# Patient Record
Sex: Male | Born: 1975 | Race: Black or African American | Hispanic: No | Marital: Single | Smoking: Current some day smoker
Health system: Southern US, Community
[De-identification: ages and names within clinical notes are randomized; demographics above are authoritative.]

---

## 2001-01-31 ENCOUNTER — Emergency Department (HOSPITAL_COMMUNITY): Admission: EM | Admit: 2001-01-31 | Discharge: 2001-02-01 | Payer: Self-pay | Admitting: Emergency Medicine

## 2001-01-31 ENCOUNTER — Encounter: Payer: Self-pay | Admitting: Emergency Medicine

## 2004-04-18 ENCOUNTER — Emergency Department (HOSPITAL_COMMUNITY): Admission: EM | Admit: 2004-04-18 | Discharge: 2004-04-18 | Payer: Self-pay | Admitting: Emergency Medicine

## 2007-05-17 ENCOUNTER — Emergency Department (HOSPITAL_COMMUNITY): Admission: EM | Admit: 2007-05-17 | Discharge: 2007-05-17 | Payer: Self-pay | Admitting: Emergency Medicine

## 2007-05-22 ENCOUNTER — Encounter: Admission: RE | Admit: 2007-05-22 | Discharge: 2007-05-22 | Payer: Self-pay | Admitting: Chiropractic Medicine

## 2007-07-02 ENCOUNTER — Emergency Department (HOSPITAL_COMMUNITY): Admission: EM | Admit: 2007-07-02 | Discharge: 2007-07-02 | Payer: Self-pay | Admitting: Emergency Medicine

## 2007-07-13 ENCOUNTER — Emergency Department (HOSPITAL_COMMUNITY): Admission: EM | Admit: 2007-07-13 | Discharge: 2007-07-14 | Payer: Self-pay | Admitting: Emergency Medicine

## 2008-08-27 IMAGING — CR DG LUMBAR SPINE COMPLETE 4+V
5 series · 5 of 5 positions shown · non-contrast
Comparison: 05/22/2007

CLINICAL DATA: Lumbar back pain range the left leg

LUMBAR SPINE - COMPLETE 4+ VIEW

[t l-spine a.p.]
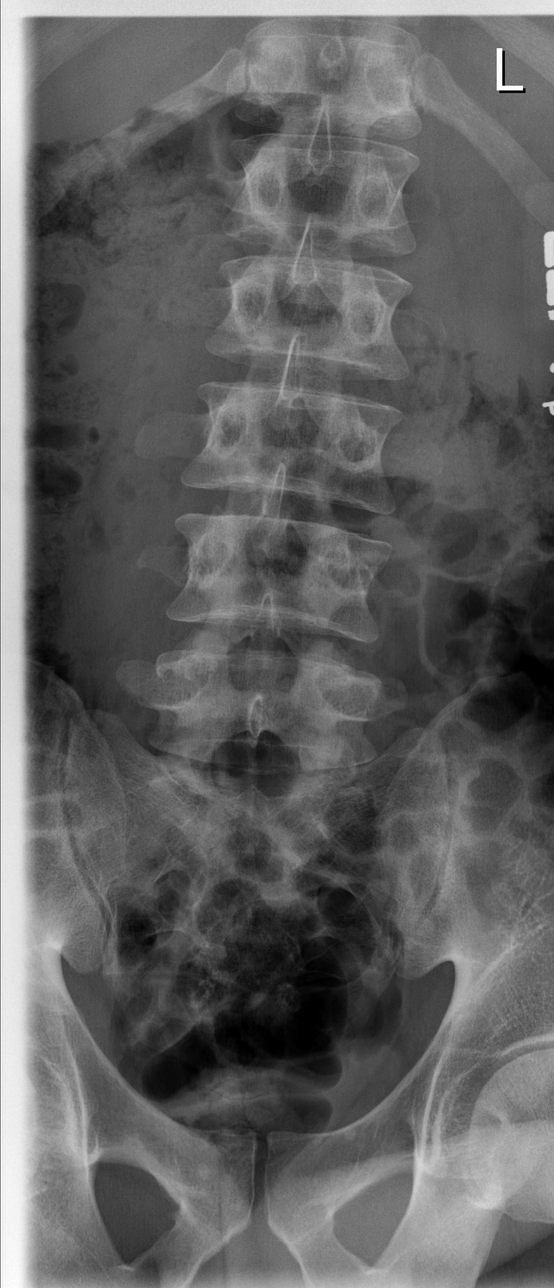

[t l-spine oblique exposure (1 of 2)]
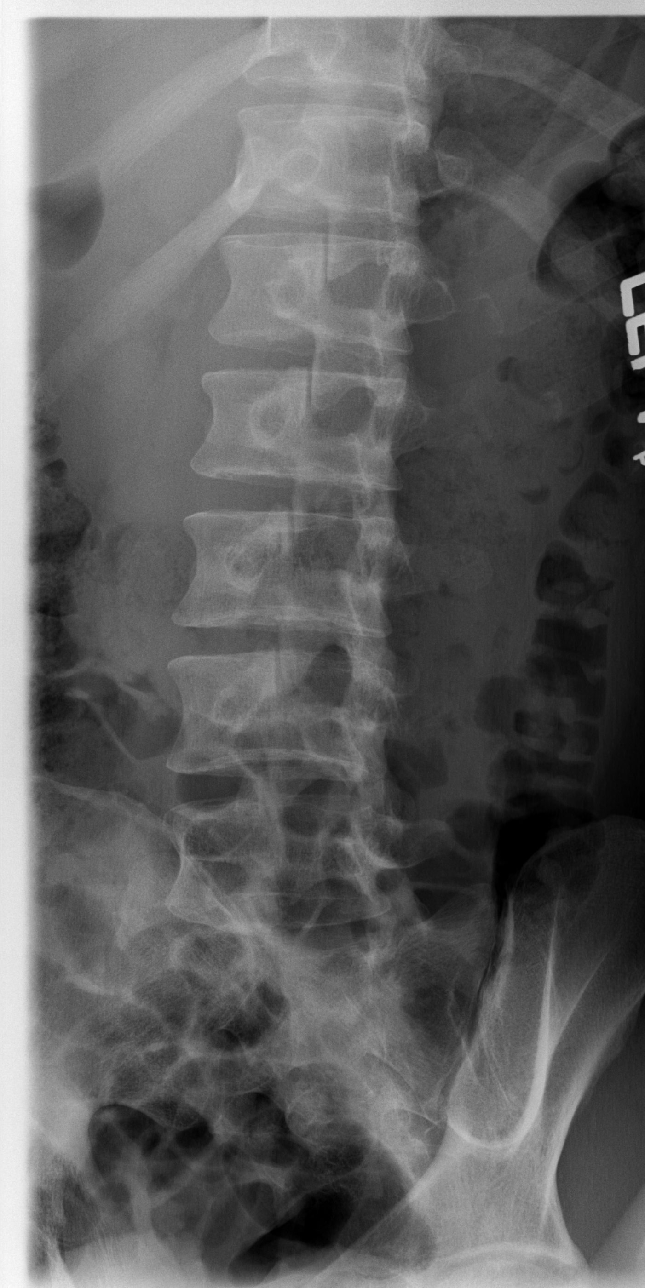

[t l-spine oblique exposure (2 of 2)]
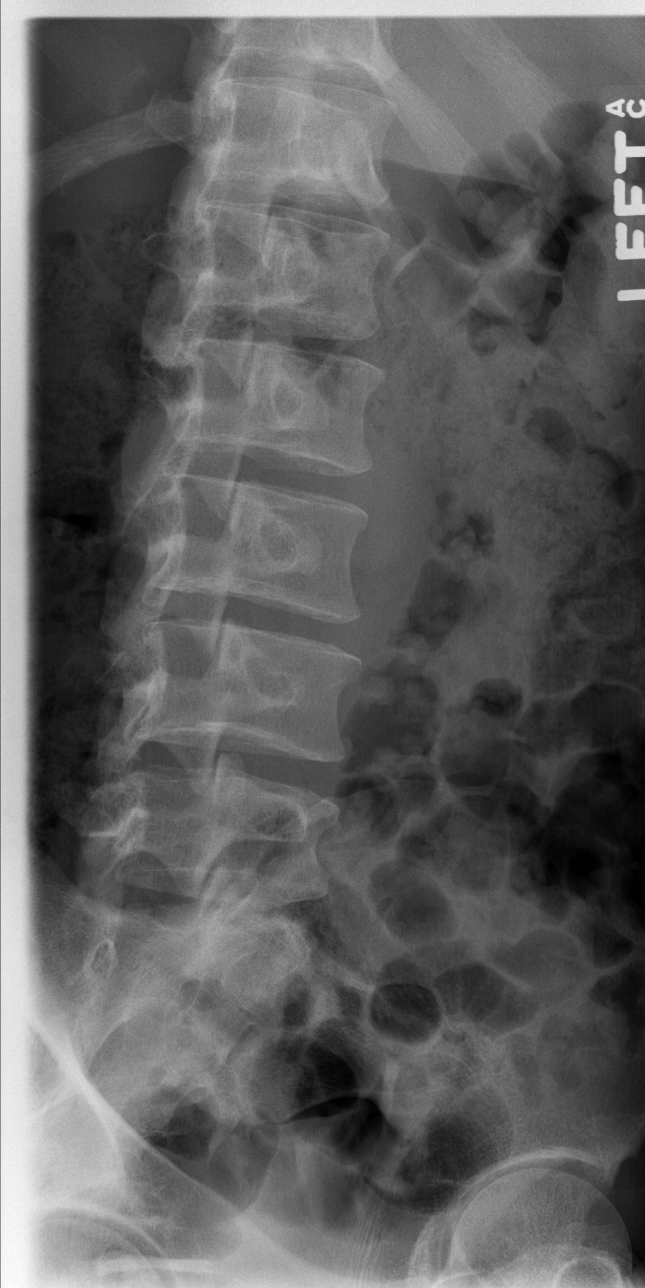

[t l-spine lat]
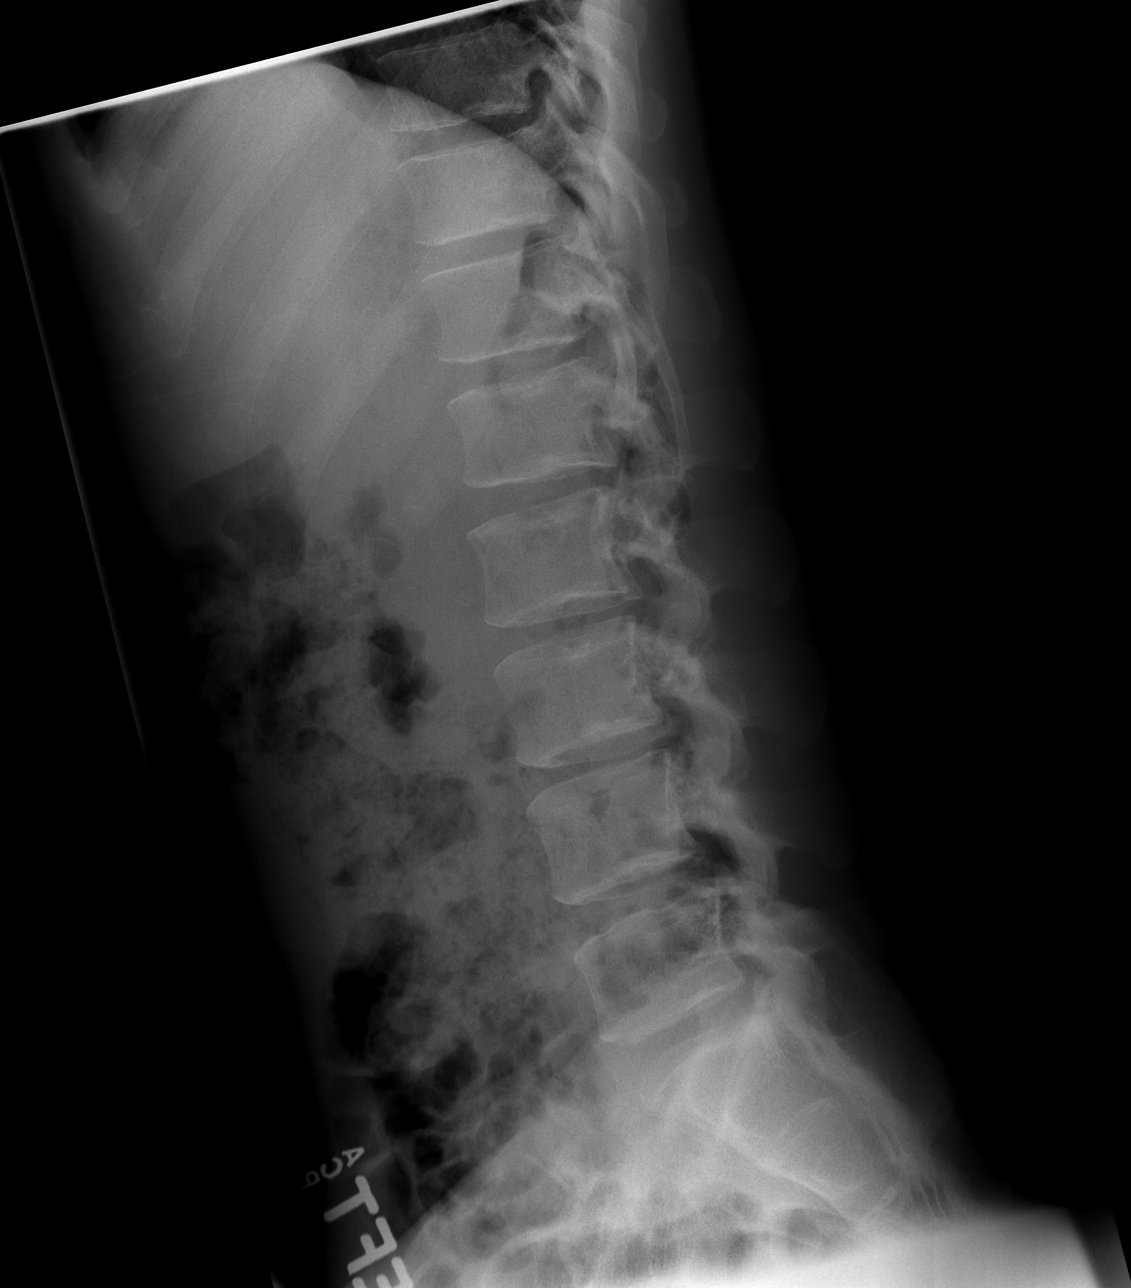

[t l-spine l5-s1 spot]
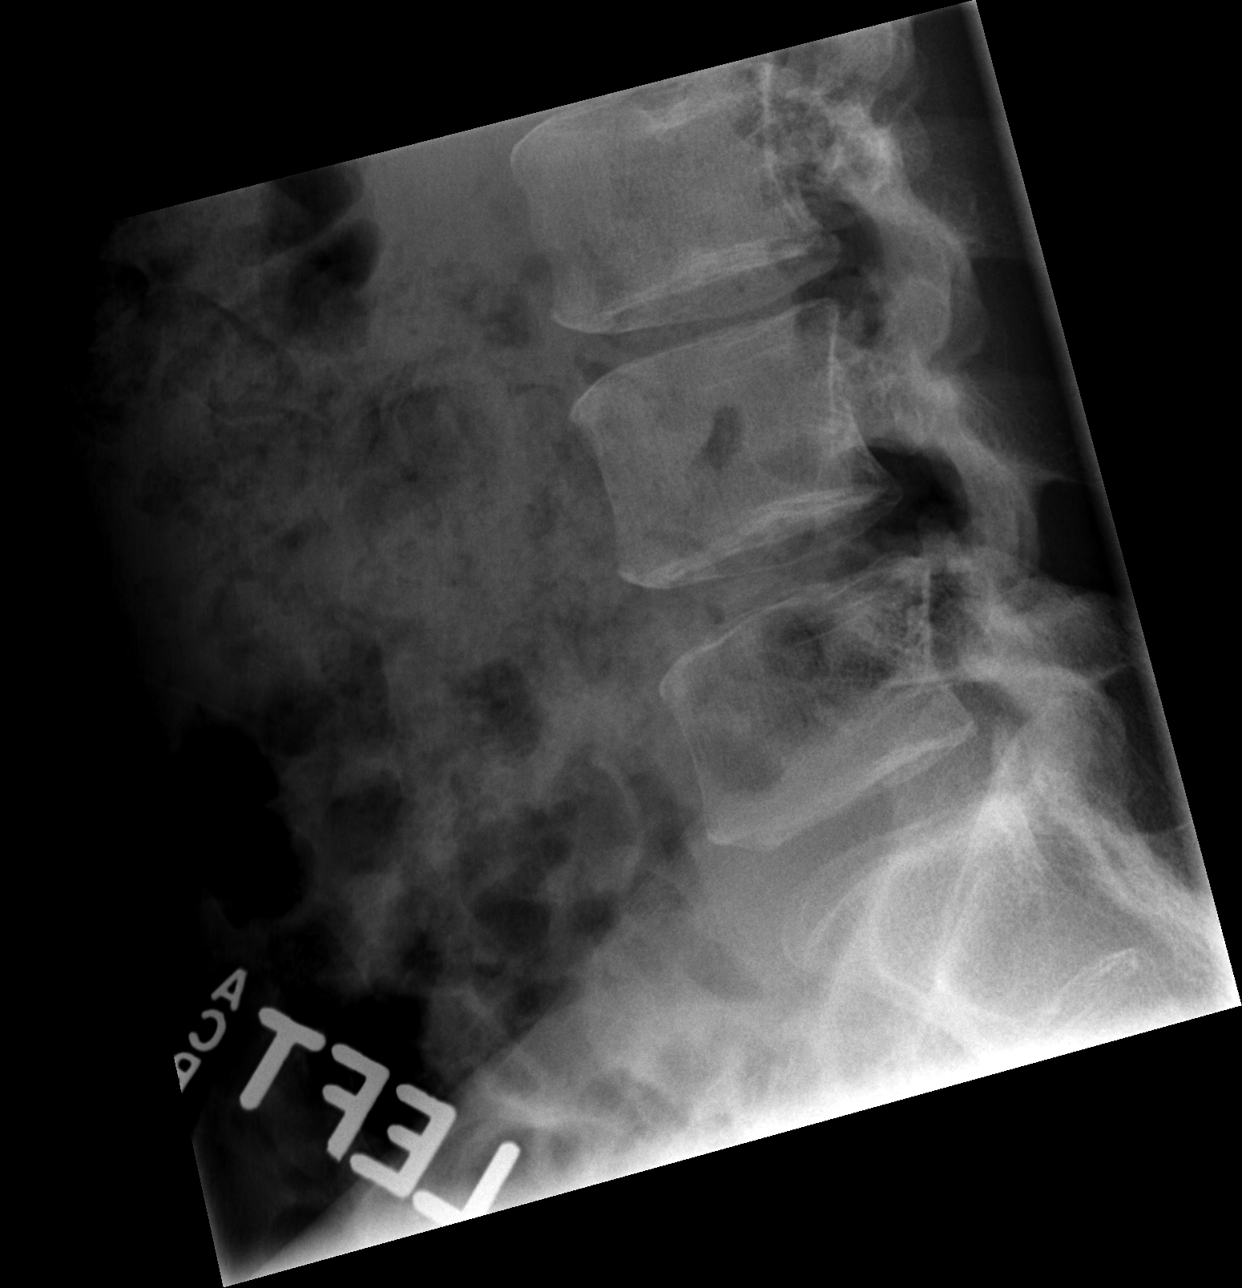

[5 of 5 positions shown; findings below may reference images not displayed]

FINDINGS: There is no evidence of lumbar spine fracture.  Alignment
is normal.  Intervertebral disc spaces are maintained.
IMPRESSION: Negative.

## 2011-07-06 ENCOUNTER — Encounter (HOSPITAL_COMMUNITY): Payer: Self-pay | Admitting: *Deleted

## 2011-07-06 ENCOUNTER — Emergency Department (HOSPITAL_COMMUNITY): Payer: Self-pay

## 2011-07-06 ENCOUNTER — Emergency Department (HOSPITAL_COMMUNITY)
Admission: EM | Admit: 2011-07-06 | Discharge: 2011-07-06 | Disposition: A | Discharge status: Home / Self Care | Payer: Self-pay | Attending: Emergency Medicine | Admitting: Emergency Medicine

## 2011-07-06 DIAGNOSIS — J069 Acute upper respiratory infection, unspecified: Secondary | ICD-10-CM | POA: Insufficient documentation

## 2011-07-06 DIAGNOSIS — R05 Cough: Secondary | ICD-10-CM

## 2011-07-06 DIAGNOSIS — F17200 Nicotine dependence, unspecified, uncomplicated: Secondary | ICD-10-CM | POA: Insufficient documentation

## 2011-07-06 MED ORDER — BENZONATATE 100 MG PO CAPS: 100.0000 mg | ORAL_CAPSULE | Freq: Three times a day (TID) | ORAL | Status: AC | PRN

## 2011-07-06 MED ORDER — ALBUTEROL SULFATE HFA 108 (90 BASE) MCG/ACT IN AERS
2.0000 | INHALATION_SPRAY | RESPIRATORY_TRACT | Status: AC
  Administered 2011-07-06: 2 via RESPIRATORY_TRACT
  Filled 2011-07-06: qty 6.7

## 2011-07-06 NOTE — ED Notes (Signed)
Pt states cough for several weeks.  States Mon am he awoke with body aches.  When pt coughed, he "noticed how painful ribs and lower back was" and has been coughing up "yellow/green" sputum.

## 2011-07-06 NOTE — Discharge Instructions (Signed)
RESOURCE GUIDE  Dental Problems  Patients with Medicaid: [facility]                     [facility] [address]                                           [address] Phone:  [phone]                                                  Phone:  [phone]  If unable to pay or uninsured, contact:  [facility] or [facility] to become qualified for the adult dental clinic.  Chronic Pain Problems Contact [facility]  [phone] Patients need to be referred by their primary care doctor.  Insufficient Money for Medicine Contact [employer]:  call "211" or [facility] 271-5999.  No Primary Care Doctor Call [facility]  832-8000 Other agencies that provide inexpensive medical care    [facility]  832-8035    [facility]  832-7272    [facility]  [phone]    [facility] Clinic  832-4777    [facility]  [phone]    [facility]  [phone]  Psychological Services [facility]  [phone] [facility]  [phone] [facility]   800 853-5163 (emergency services 641-4993)  Substance Abuse Resources [facility]  336-882-2125 [facility] 336-784-9470 [facility] 336-285-9073 [facility] 336-845-3988 Residential & Outpatient Substance Abuse Program  800-659-3381  Abuse/Neglect [place] Child Abuse Hotline (336) 641-3795 [place] [facility] Abuse Hotline 800-378-5315 (After Hours)  Emergency Shelter [facility] (336) 271-5985  M[facility] (336) 275-9566 [facility] (704) 372-4663  MRSA Hotline #:   832-7006    [place] [facility]  [facility] [place] [facility]     [employer]                          [facility] [address] [place]                       [address]      [address]  [place]                                                [place]                            [place] Phone:  [phone]                                   Phone:  [phone]                 Phone:  [phone]  [facility] Phone:  [phone]    [facility] Child Abuse Hotline 480-442-9637 8722854675 (After Hours)   Take the prescription as directed.  Use your albuterol inhaler (2 to 4 puffs) every 4 hours for the next 7 days, then as needed for cough, wheezing, or shortness of breath.  Take over the counter sudafed, as directed on packaging, for the next week.  Use over the counter normal saline nasal spray, as instructed in the Emergency Department, several times per day for the next 2 weeks.  Take over the counter tylenol and ibuprofen, as directed on packaging, as needed for discomfort.  Call your regular medical doctor today to schedule a follow up appointment within the next week.  Return to the Emergency Department immediately sooner if worsening.

## 2011-07-06 NOTE — ED Provider Notes (Signed)
History     CSN: [id number]  Arrival date & time 07/06/11  4098   First MD Initiated Contact with Patient 07/06/11 [phone]      Chief Complaint  Patient presents with  . Cough    malaise     HPI Pt was seen at 0715.  Per pt, c/o gradual onset and persistence of intermittent cough for the past week.  Describes the cough as productive of "yellow" sputum.  Denies fevers, no sore throat, no rash.    History reviewed. No pertinent past medical history.  History reviewed. No pertinent past surgical history.   History  Substance Use Topics  . Smoking status: Current Some Day Smoker  . Smokeless tobacco: Not on file  . Alcohol Use: 0.6 oz/week    1 Cans of beer per week     per day      Review of Systems ROS: Statement: All systems negative except as marked or noted in the HPI; Constitutional: Negative for fever and chills. ; ; Eyes: Negative for eye pain, redness and discharge. ; ; ENMT: Negative for ear pain, hoarseness, nasal congestion, sinus pressure and sore throat. ; ; Cardiovascular: Negative for chest pain, palpitations, diaphoresis, dyspnea and peripheral edema. ; ; Respiratory: +cough. Negative for wheezing and stridor. ; ; Gastrointestinal: Negative for nausea, vomiting, diarrhea, abdominal pain, blood in stool, hematemesis, jaundice and rectal bleeding. . ; ; Genitourinary: Negative for dysuria, flank pain and hematuria. ; ; Musculoskeletal: Negative for back pain and neck pain. Negative for swelling and trauma.; ; Skin: Negative for pruritus, rash, abrasions, blisters, bruising and skin lesion.; ; Neuro: Negative for headache, lightheadedness and neck stiffness. Negative for weakness, altered level of consciousness , altered mental status, extremity weakness, paresthesias, involuntary movement, seizure and syncope.     Allergies  Review of patient's allergies indicates no known allergies.  Home Medications  No current outpatient prescriptions on file.  BP 151/105   Pulse 90  Temp 98 F (36.7 C)  Resp 18  SpO2 100%  Physical Exam 0720: Physical examination:  Nursing notes reviewed; Vital signs and O2 SAT reviewed;  Constitutional: Well developed, Well nourished, Well hydrated, In no acute distress; Head:  Normocephalic, atraumatic; Eyes: EOMI, PERRL, No scleral icterus; ENMT: TM's clear bilat, +edemetous nasal turbinates bilat with clear rhinorrhea.  Mouth and pharynx normal, Mucous membranes moist; Neck: Supple, Full range of motion; Cardiovascular: Regular rate and rhythm, No murmur or gallop; Respiratory: Breath sounds clear & equal bilaterally, No wheezes, speaking full sentences with ease, Normal respiratory effort/excursion; Chest: Nontender, Movement normal;  Extremities: Pulses normal, No tenderness, No edema, No calf edema or asymmetry.; Neuro: AA&Ox3, Major CN grossly intact.  No gross focal motor or sensory deficits in extremities.; Skin: Color normal, Warm, Dry, no rash.    ED Course  Procedures    MDM  MDM Reviewed: nursing note and vitals Interpretation: x-ray     Dg Chest 2 View 07/06/2011  *RADIOLOGY REPORT*  Clinical Data: Cough and chest soreness.  CHEST - 2 VIEW  Comparison: Chest x-ray 05/17/2007.  Findings: Lung volumes are normal.  No consolidative airspace disease.  No pleural effusions.  No pneumothorax.  No pulmonary nodule or mass noted.  Pulmonary vasculature and the cardiomediastinal silhouette are within normal limits.  IMPRESSION: 1. No radiographic evidence of acute cardiopulmonary disease.  Original Report Authenticated By: Florencia Reasons, M.D.      8:18 AM:  No pneumonia on CXR.  Afebrile, Sats 100% R/A.  Feels better  after neb.  Will tx symptomatically for now.  Dx testing d/w pt and family.  Questions answered.  Verb understanding, agreeable to d/c home with outpt f/u.     Laray Anger, DO 07/07/11 1155

## 2012-08-19 IMAGING — CR DG CHEST 2V
2 series · 2 of 2 positions shown · non-contrast
Comparison: Chest x-ray 05/17/2007.

CLINICAL DATA: Cough and chest soreness.

CHEST - 2 VIEW

[w chest pa]
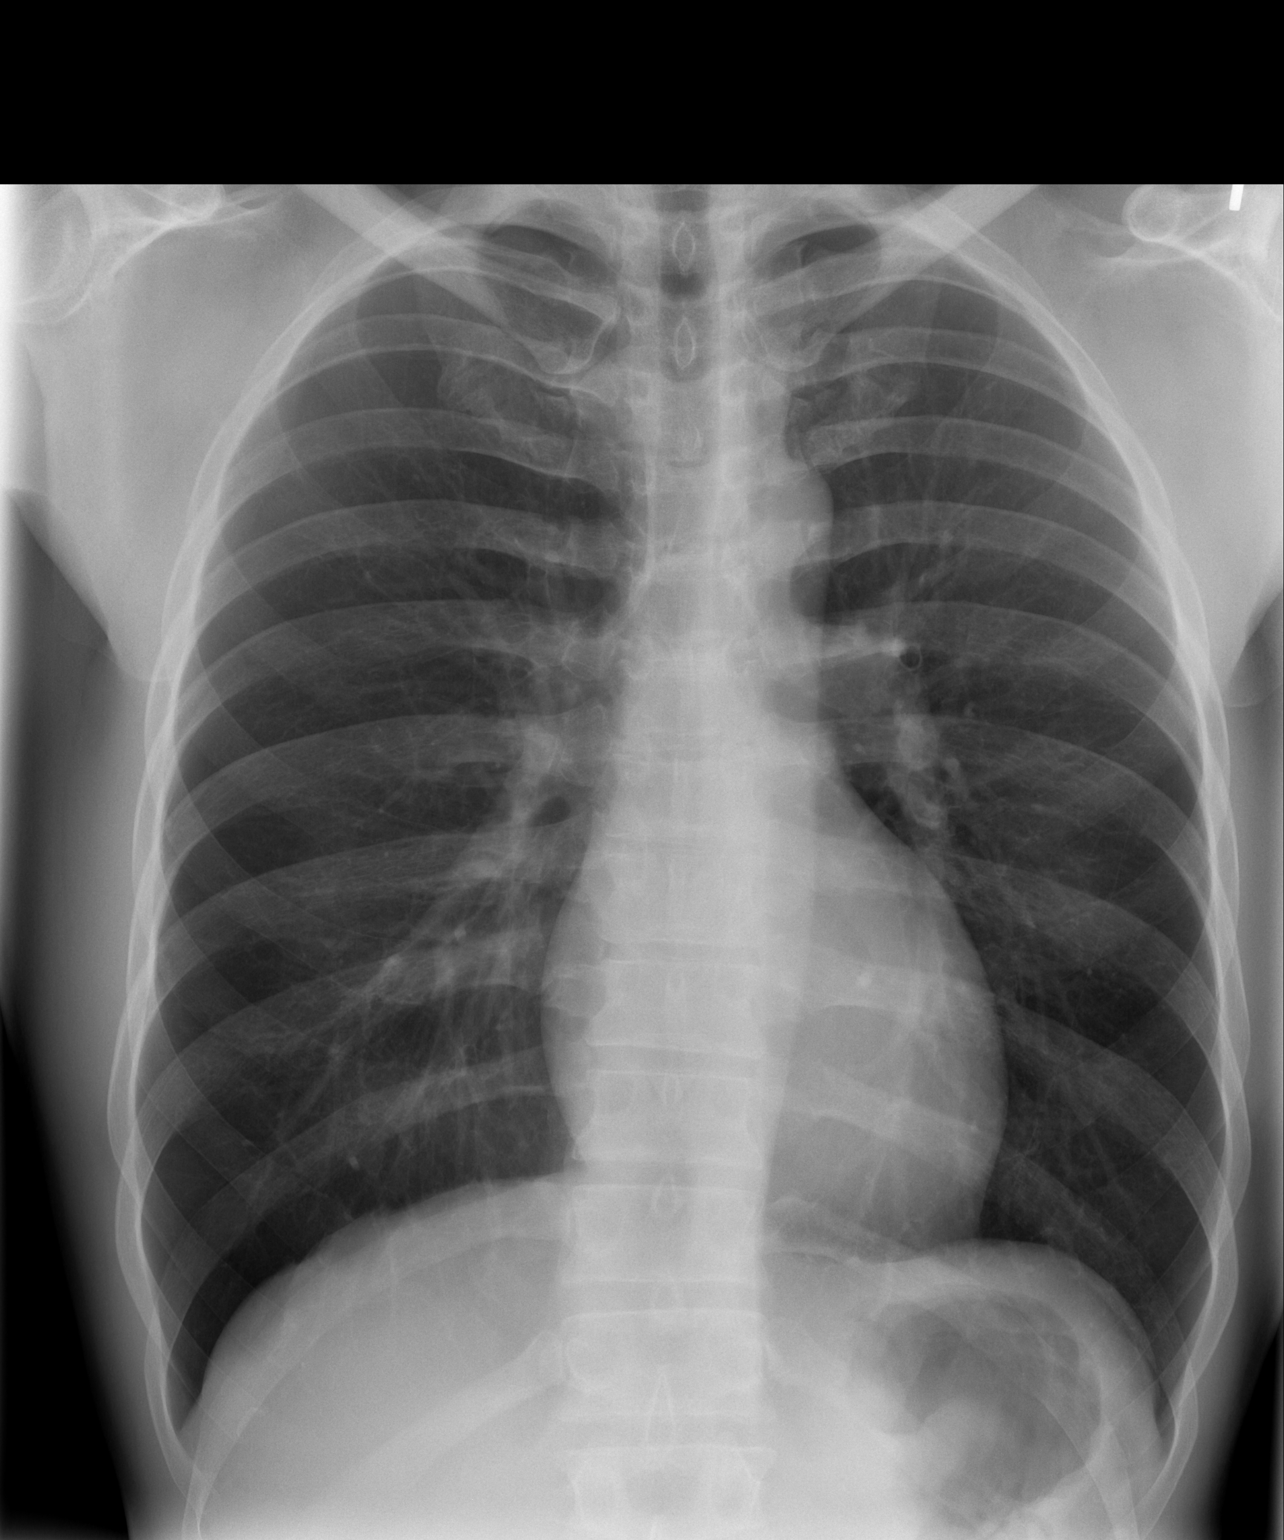

[w chest lat]
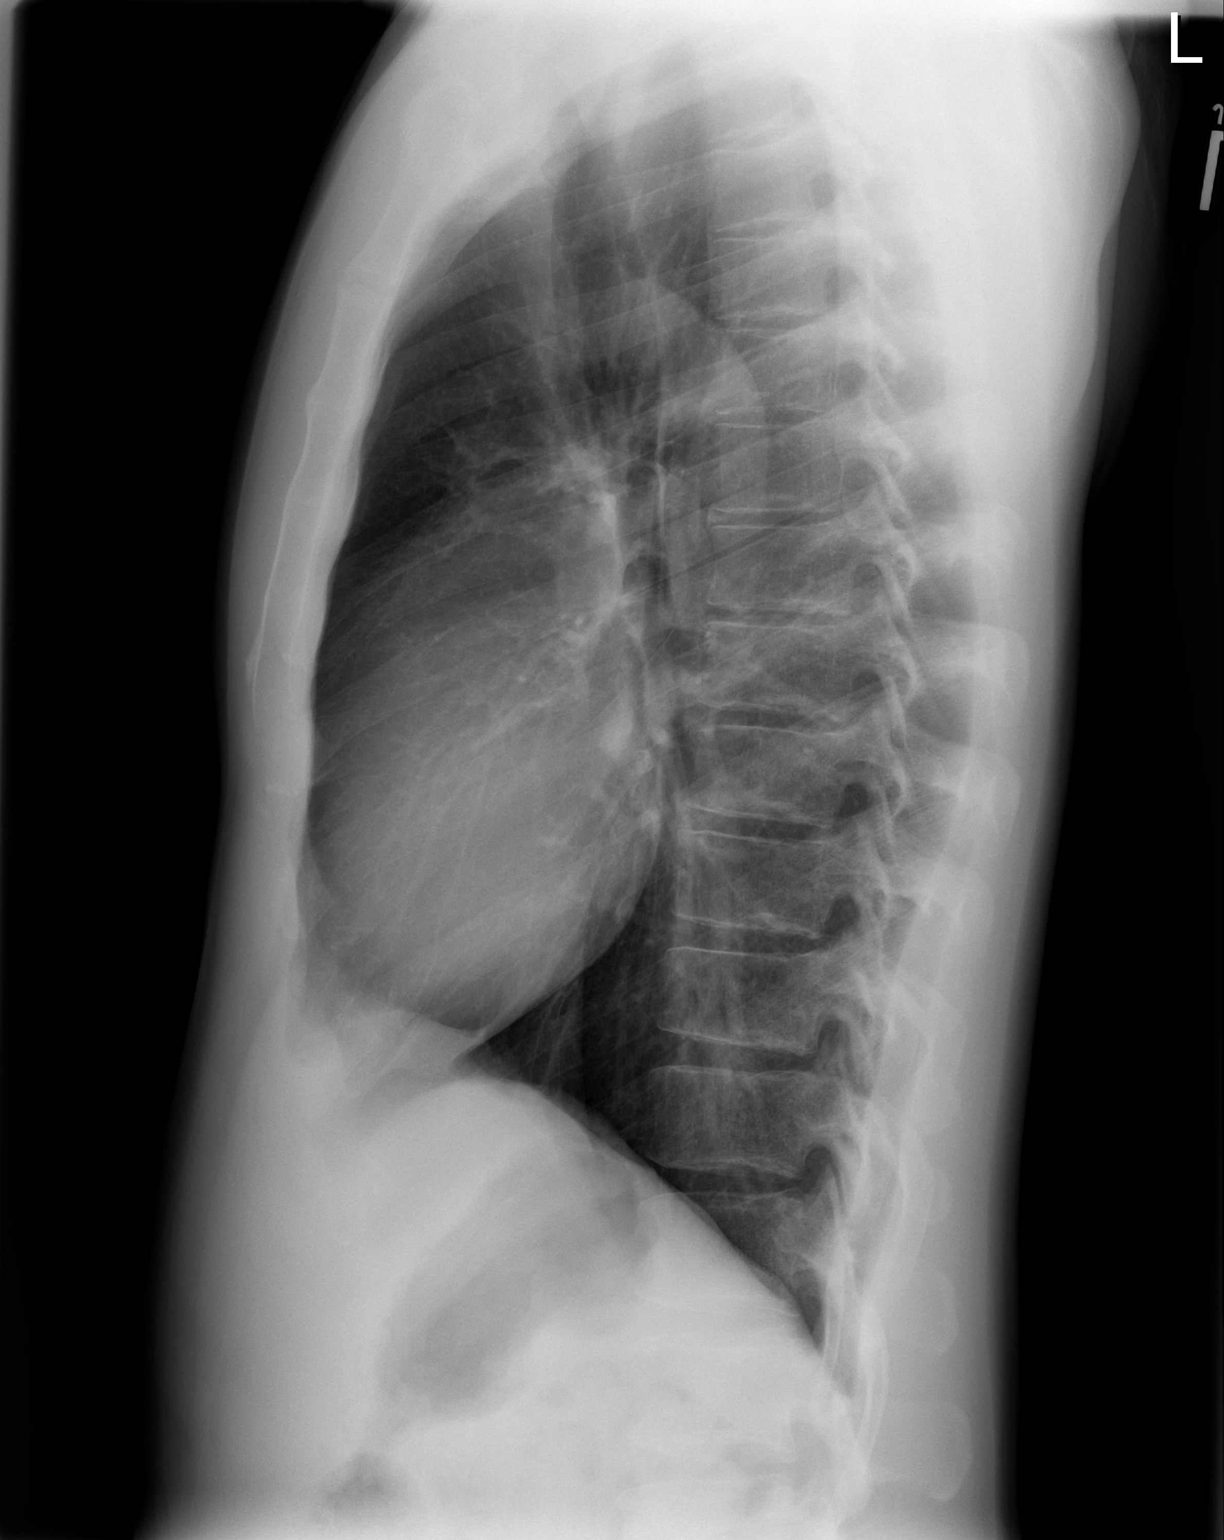

[2 of 2 positions shown; findings below may reference images not displayed]

FINDINGS: Lung volumes are normal.  No consolidative airspace
disease.  No pleural effusions.  No pneumothorax.  No pulmonary
nodule or mass noted.  Pulmonary vasculature and the
cardiomediastinal silhouette are within normal limits.
IMPRESSION: 1. No radiographic evidence of acute cardiopulmonary disease.

## 2019-09-20 ENCOUNTER — Other Ambulatory Visit: Payer: Self-pay

## 2019-09-20 ENCOUNTER — Emergency Department (HOSPITAL_COMMUNITY)
Admission: EM | Admit: 2019-09-20 | Discharge: 2019-09-21 | Disposition: A | Discharge status: Home / Self Care | Payer: Self-pay | Attending: Emergency Medicine | Admitting: Emergency Medicine

## 2019-09-20 DIAGNOSIS — Z5321 Procedure and treatment not carried out due to patient leaving prior to being seen by health care provider: Secondary | ICD-10-CM | POA: Insufficient documentation

## 2019-09-20 DIAGNOSIS — L509 Urticaria, unspecified: Secondary | ICD-10-CM | POA: Insufficient documentation

## 2019-09-20 NOTE — ED Triage Notes (Signed)
Pt states she got bit by insect 2 days ago and he is having generalized body aches and hives on his body, pt states he took Tylenol and smoke weed yesterday trying to get some relief and nothing worked. Pt is able to talk on full sentences on triage NAD noticed.

## 2019-09-21 NOTE — ED Notes (Signed)
Pt called x 3  No answer. 

## 2020-11-03 ENCOUNTER — Emergency Department (HOSPITAL_COMMUNITY)
Admission: EM | Admit: 2020-11-03 | Discharge: 2020-11-04 | Disposition: A | Discharge status: Home / Self Care | Payer: Self-pay | Attending: Emergency Medicine | Admitting: Emergency Medicine

## 2020-11-03 ENCOUNTER — Other Ambulatory Visit: Payer: Self-pay

## 2020-11-03 ENCOUNTER — Encounter (HOSPITAL_COMMUNITY): Payer: Self-pay | Admitting: Emergency Medicine

## 2020-11-03 DIAGNOSIS — T401X1A Poisoning by heroin, accidental (unintentional), initial encounter: Secondary | ICD-10-CM | POA: Insufficient documentation

## 2020-11-03 DIAGNOSIS — Y901 Blood alcohol level of 20-39 mg/100 ml: Secondary | ICD-10-CM | POA: Insufficient documentation

## 2020-11-03 DIAGNOSIS — X58XXXA Exposure to other specified factors, initial encounter: Secondary | ICD-10-CM | POA: Insufficient documentation

## 2020-11-03 DIAGNOSIS — F17200 Nicotine dependence, unspecified, uncomplicated: Secondary | ICD-10-CM | POA: Insufficient documentation

## 2020-11-03 NOTE — ED Triage Notes (Signed)
Patient arrives BIB EMS s/p heroin OD. Patient received 1 mg IN per fire, patient agonal on EMS arrival still. Patient given 1 mg IV per EMS. Patient is now alert and breathing independently.

## 2020-11-04 LAB — COMPREHENSIVE METABOLIC PANEL
ALT: 21 U/L (ref 0–44)
AST: 25 U/L (ref 15–41)
Albumin: 3.9 g/dL (ref 3.5–5.0)
Alkaline Phosphatase: 56 U/L (ref 38–126)
Anion gap: 9 (ref 5–15)
BUN: 10 mg/dL (ref 6–20)
CO2: 25 mmol/L (ref 22–32)
Calcium: 8.8 mg/dL — ABNORMAL LOW (ref 8.9–10.3)
Chloride: 103 mmol/L (ref 98–111)
Creatinine, Ser: 1.09 mg/dL (ref 0.61–1.24)
GFR, Estimated: >60 mL/min (ref >60)
Glucose, Bld: 104 mg/dL — ABNORMAL HIGH (ref 70–99)
Potassium: 4.6 mmol/L (ref 3.5–5.1)
Sodium: 137 mmol/L (ref 135–145)
Total Bilirubin: 0.4 mg/dL (ref 0.3–1.2)
Total Protein: 7 g/dL (ref 6.5–8.1)

## 2020-11-04 LAB — CBC WITH DIFFERENTIAL/PLATELET
Abs Immature Granulocytes: 0.07 10*3/uL (ref 0.00–0.07)
Basophils Absolute: 0.1 10*3/uL (ref 0.0–0.1)
Basophils Relative: 0 %
Eosinophils Absolute: 0 10*3/uL (ref 0.0–0.5)
Eosinophils Relative: 0 %
HCT: 47.7 % (ref 39.0–52.0)
Hemoglobin: 15.6 g/dL (ref 13.0–17.0)
Immature Granulocytes: 1 %
Lymphocytes Relative: 10 %
Lymphs Abs: 1.4 10*3/uL (ref 0.7–4.0)
MCH: 29.4 pg (ref 26.0–34.0)
MCHC: 32.7 g/dL (ref 30.0–36.0)
MCV: 90 fL (ref 80.0–100.0)
Monocytes Absolute: 1.1 10*3/uL — ABNORMAL HIGH (ref 0.1–1.0)
Monocytes Relative: 8 %
Neutro Abs: 11.3 10*3/uL — ABNORMAL HIGH (ref 1.7–7.7)
Neutrophils Relative %: 81 %
Platelets: 222 10*3/uL (ref 150–400)
RBC: 5.3 MIL/uL (ref 4.22–5.81)
RDW: 13 % (ref 11.5–15.5)
WBC: 13.9 10*3/uL — ABNORMAL HIGH (ref 4.0–10.5)
nRBC: 0 % (ref 0.0–0.2)

## 2020-11-04 LAB — ETHANOL: Alcohol, Ethyl (B): 30 mg/dL — ABNORMAL HIGH (ref <10)

## 2020-11-04 MED ORDER — SODIUM CHLORIDE 0.9 % IV BOLUS
1000.0000 mL | Freq: Once | INTRAVENOUS | Status: AC
  Administered 2020-11-04: 1000 mL via INTRAVENOUS

## 2020-11-04 MED ORDER — KETOROLAC TROMETHAMINE 30 MG/ML IJ SOLN
30.0000 mg | Freq: Once | INTRAMUSCULAR | Status: AC
  Administered 2020-11-04: 30 mg via INTRAVENOUS
  Filled 2020-11-04: qty 1

## 2020-11-04 NOTE — ED Notes (Signed)
Patient is asleep in room.  

## 2020-11-04 NOTE — ED Provider Notes (Signed)
[facility]-EMERGENCY DEPT Provider Note   CSN: [id number] Arrival date & time: 11/03/20  2326     History Chief Complaint  Patient presents with   Drug Overdose    Micheal Rose is a 45 y.o. male.  Patient is a 45 year old male brought by EMS after heroin overdose.  Patient apparently snorted heroin this evening, then became unresponsive.  He was found apneic and was given intranasal Narcan by fire department, then intravenous Narcan by EMS.  Patient is now alert and back to baseline.  He is complaining of headache, but has no other complaints.  He tells me he was also consuming alcohol this evening.  The history is provided by the patient.  Drug Overdose This is a new problem. The current episode started 1 to 2 hours ago. The problem occurs constantly. The problem has been gradually improving. Nothing aggravates the symptoms. Relieved by: Narcan. He has tried nothing for the symptoms.      History reviewed. No pertinent past medical history.  There are no problems to display for this patient.   History reviewed. No pertinent surgical history.     No family history on file.  Social History   Tobacco Use   Smoking status: Some Days   Smokeless tobacco: Never  Substance Use Topics   Alcohol use: Yes    Alcohol/week: 1.0 standard drink    Types: 1 Cans of beer per week    Comment: per day   Drug use: No    Home Medications Prior to Admission medications   Not on File    Allergies    Patient has no known allergies.  Review of Systems   Review of Systems  All other systems reviewed and are negative.  Physical Exam Updated Vital Signs BP 124/82   Pulse 82   Temp 98.9 F (37.2 C) (Oral)   Resp 14   Ht 6' (1.829 m)   Wt 72.6 kg   SpO2 100%   BMI 21.70 kg/m   Physical Exam Vitals and nursing note reviewed.  Constitutional:      General: He is not in acute distress.    Appearance: He is well-developed. He is not diaphoretic.   HENT:     Head: Normocephalic and atraumatic.  Eyes:     Extraocular Movements: Extraocular movements intact.     Pupils: Pupils are equal, round, and reactive to light.  Cardiovascular:     Rate and Rhythm: Normal rate and regular rhythm.     Heart sounds: No murmur heard.   No friction rub.  Pulmonary:     Effort: Pulmonary effort is normal. No respiratory distress.     Breath sounds: Normal breath sounds. No wheezing or rales.  Abdominal:     General: Bowel sounds are normal. There is no distension.     Palpations: Abdomen is soft.     Tenderness: There is no abdominal tenderness.  Musculoskeletal:        General: Normal range of motion.     Cervical back: Normal range of motion and neck supple.  Skin:    General: Skin is warm and dry.  Neurological:     General: No focal deficit present.     Mental Status: He is alert and oriented to person, place, and time. Mental status is at baseline.     Cranial Nerves: No cranial nerve deficit.     Coordination: Coordination normal.    ED Results / Procedures / Treatments   Labs (  all labs ordered are listed, but only abnormal results are displayed) Labs Reviewed - No data to display  EKG None  Radiology No results found.  Procedures Procedures   Medications Ordered in ED Medications  sodium chloride 0.9 % bolus 1,000 mL (has no administration in time range)  ketorolac (TORADOL) 30 MG/ML injection 30 mg (has no administration in time range)    ED Course  I have reviewed the triage vital signs and the nursing notes.  Pertinent labs & imaging results that were available during my care of the patient were reviewed by me and considered in my medical decision making (see chart for details).    MDM Rules/Calculators/A&P  Patient brought by EMS after an overdose of heroin.  Patient received Narcan in the field and has been awake and alert since.  He has been observed here for multiple hours and mental status remains clear.   Work-up unremarkable otherwise.  Patient appropriate for discharge.  Final Clinical Impression(s) / ED Diagnoses Final diagnoses:  None    Rx / DC Orders ED Discharge Orders     None        Geoffery Lyons, MD 11/04/20 [phone]

## 2020-11-04 NOTE — Discharge Instructions (Addendum)
Follow-up with outpatient substance abuse treatment if so desired.  The resource guide for substance abuse treatment centers has been provided in this discharge summary.
